# Patient Record
Sex: Female | Born: 1995 | Race: White | Hispanic: No | Marital: Single | Smoking: Never smoker
Health system: Southern US, Community
[De-identification: ages and names within clinical notes are randomized; demographics above are authoritative.]

---
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
## 2016-03-15 DIAGNOSIS — R05 Cough: Secondary | ICD-10-CM | POA: Diagnosis not present

## 2016-03-15 DIAGNOSIS — J069 Acute upper respiratory infection, unspecified: Secondary | ICD-10-CM | POA: Diagnosis not present

## 2016-03-15 DIAGNOSIS — R599 Enlarged lymph nodes, unspecified: Secondary | ICD-10-CM | POA: Diagnosis not present

## 2016-03-16 DIAGNOSIS — J209 Acute bronchitis, unspecified: Secondary | ICD-10-CM | POA: Diagnosis not present

## 2016-03-16 DIAGNOSIS — J301 Allergic rhinitis due to pollen: Secondary | ICD-10-CM | POA: Diagnosis not present

## 2016-08-15 DIAGNOSIS — J0100 Acute maxillary sinusitis, unspecified: Secondary | ICD-10-CM | POA: Diagnosis not present

## 2016-11-02 DIAGNOSIS — K3589 Other acute appendicitis: Secondary | ICD-10-CM | POA: Diagnosis not present

## 2017-02-21 DIAGNOSIS — E860 Dehydration: Secondary | ICD-10-CM | POA: Diagnosis not present

## 2017-02-21 DIAGNOSIS — K529 Noninfective gastroenteritis and colitis, unspecified: Secondary | ICD-10-CM | POA: Diagnosis not present

## 2017-02-21 DIAGNOSIS — J209 Acute bronchitis, unspecified: Secondary | ICD-10-CM | POA: Diagnosis not present

## 2017-03-30 ENCOUNTER — Encounter: Payer: Self-pay | Admitting: Internal Medicine

## 2017-03-30 ENCOUNTER — Ambulatory Visit (INDEPENDENT_AMBULATORY_CARE_PROVIDER_SITE_OTHER): Payer: BLUE CROSS/BLUE SHIELD | Admitting: Internal Medicine

## 2017-03-30 DIAGNOSIS — Z0000 Encounter for general adult medical examination without abnormal findings: Secondary | ICD-10-CM | POA: Diagnosis not present

## 2017-03-30 DIAGNOSIS — Z30011 Encounter for initial prescription of contraceptive pills: Secondary | ICD-10-CM | POA: Diagnosis not present

## 2017-03-30 MED ORDER — NORETHIN ACE-ETH ESTRAD-FE 1-20 MG-MCG PO TABS: 1.0000 | ORAL_TABLET | Freq: Every day | ORAL | 2 refills | Status: DC

## 2017-03-30 NOTE — Progress Notes (Signed)
Patient ID: Rondall Allegra, female   DOB: 05-Jun-1996, 21 y.o.   MRN: [id number]   Subjective:    Patient ID: Rondall Allegra, female    DOB: 12/06/1995, 21 y.o.   MRN: [id number]  HPI  Patient here to establish care.  Has been followed by Dr Tracey Harries at [facility].  Has been healthy.  Stays active.  Enjoys swimming.  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  In college at [employer].  Thinking about elementary education.  Stays active.  No sob.  No acid reflux.  No abdominal pain.  Bowels moving.  LMP 03/05/17.  Menarche - 5th grade.  Regular periods.  Not sexually active.  Does desire to start ocp's.  Discussed risk and possible side effects of the medication.     History reviewed. No pertinent past medical history. Past Surgical History:  Procedure Laterality Date  . TONSILLECTOMY AND ADENOIDECTOMY  2009   Family History  Problem Relation Age of Onset  . Lung cancer Paternal Grandmother    Social History   Social History  . Marital status: Single    Spouse name: N/A  . Number of children: N/A  . Years of education: N/A   Social History Main Topics  . Smoking status: Never Smoker  . Smokeless tobacco: Never Used  . Alcohol use No  . Drug use: No  . Sexual activity: Not Asked   Other Topics Concern  . None   Social History Narrative  . None    Outpatient Encounter Prescriptions as of 03/30/2017  Medication Sig  . norethindrone-ethinyl estradiol (LOESTRIN FE 1/20) 1-20 MG-MCG tablet Take 1 tablet by mouth daily.   No facility-administered encounter medications on file as of 03/30/2017.     Review of Systems  Constitutional: Negative for appetite change and unexpected weight change.  HENT: Negative for congestion and sinus pressure.   Respiratory: Negative for cough, chest tightness and shortness of breath.   Cardiovascular: Negative for chest pain, palpitations and leg swelling.  Gastrointestinal: Negative for abdominal pain, diarrhea, nausea and vomiting.    Genitourinary: Negative for difficulty urinating and dysuria.  Musculoskeletal: Negative for back pain and joint swelling.  Skin: Negative for color change and rash.  Neurological: Negative for dizziness, light-headedness and headaches.  Psychiatric/Behavioral: Negative for agitation and dysphoric mood.       Objective:     Blood pressure rechecked by me:  110/62  Physical Exam  Constitutional: She appears well-developed and well-nourished. No distress.  HENT:  Nose: Nose normal.  Mouth/Throat: Oropharynx is clear and moist.  Neck: Neck supple. No thyromegaly present.  Cardiovascular: Normal rate and regular rhythm.   Pulmonary/Chest: Breath sounds normal. No respiratory distress. She has no wheezes.  Abdominal: Soft. Bowel sounds are normal. There is no tenderness.  Musculoskeletal: She exhibits no edema or tenderness.  Lymphadenopathy:    She has no cervical adenopathy.  Skin: No rash noted. No erythema.  Psychiatric: She has a normal mood and affect. Her behavior is normal.    BP 108/78   Pulse 87   Temp 98.9 F (37.2 C) (Oral)   Resp 12   Ht 5' 4.76" (1.645 m)   Wt 127 lb 12 oz (57.9 kg)   LMP 03/15/2017   SpO2 98%   BMI 21.41 kg/m  Wt Readings from Last 3 Encounters:  03/30/17 127 lb 12 oz (57.9 kg) (51 %, Z= 0.01)*   * Growth percentiles are based on CDC 2-20 Years data.  Assessment & Plan:   Problem List Items Addressed This Visit    Health care maintenance    States up to date with immunizations.  Obtain record from pediatrician.  Schedule for yearly physical.        Oral contraception initiation    Discussed with her today.  Discussed possible side effects and risk of medication.  Discussed when to start medication.  Blood pressure looks good.  Start loestrin as directed.  Get her back in  - in several weeks to check blood pressure to confirm normal after starting ocp's.            Dale DurhamSCOTT, Josha Weekley, MD

## 2017-04-01 ENCOUNTER — Encounter: Payer: Self-pay | Admitting: Internal Medicine

## 2017-04-01 DIAGNOSIS — Z30011 Encounter for initial prescription of contraceptive pills: Secondary | ICD-10-CM | POA: Insufficient documentation

## 2017-04-01 DIAGNOSIS — Z0000 Encounter for general adult medical examination without abnormal findings: Secondary | ICD-10-CM | POA: Insufficient documentation

## 2017-04-01 NOTE — Assessment & Plan Note (Signed)
States up to date with immunizations.  Obtain record from pediatrician.  Schedule for yearly physical.

## 2017-04-01 NOTE — Assessment & Plan Note (Signed)
Discussed with her today.  Discussed possible side effects and risk of medication.  Discussed when to start medication.  Blood pressure looks good.  Start loestrin as directed.  Get her back in  - in several weeks to check blood pressure to confirm normal after starting ocp's.

## 2017-04-09 ENCOUNTER — Encounter: Payer: Self-pay | Admitting: Internal Medicine

## 2017-08-22 ENCOUNTER — Ambulatory Visit (INDEPENDENT_AMBULATORY_CARE_PROVIDER_SITE_OTHER): Payer: BLUE CROSS/BLUE SHIELD

## 2017-08-22 ENCOUNTER — Encounter: Payer: Self-pay | Admitting: Internal Medicine

## 2017-08-22 ENCOUNTER — Ambulatory Visit (INDEPENDENT_AMBULATORY_CARE_PROVIDER_SITE_OTHER): Payer: BLUE CROSS/BLUE SHIELD | Admitting: Internal Medicine

## 2017-08-22 VITALS — BP 114/80 | HR 117 | Temp 100.1°F | Ht 64.96 in | Wt 125.0 lb

## 2017-08-22 DIAGNOSIS — R509 Fever, unspecified: Secondary | ICD-10-CM

## 2017-08-22 DIAGNOSIS — R059 Cough, unspecified: Secondary | ICD-10-CM

## 2017-08-22 DIAGNOSIS — R05 Cough: Secondary | ICD-10-CM

## 2017-08-22 DIAGNOSIS — J181 Lobar pneumonia, unspecified organism: Secondary | ICD-10-CM

## 2017-08-22 DIAGNOSIS — J189 Pneumonia, unspecified organism: Secondary | ICD-10-CM

## 2017-08-22 LAB — POCT INFLUENZA A/B
INFLUENZA B, POC: NEGATIVE
Influenza A, POC: NEGATIVE

## 2017-08-22 LAB — POCT URINE PREGNANCY: Preg Test, Ur: NEGATIVE

## 2017-08-22 MED ORDER — PREDNISONE 10 MG PO TABS: ORAL_TABLET | ORAL | 0 refills | Status: DC

## 2017-08-22 MED ORDER — AZITHROMYCIN 250 MG PO TABS: ORAL_TABLET | ORAL | 0 refills | Status: DC

## 2017-08-22 NOTE — Progress Notes (Signed)
Patient ID: Rondall AllegraKayleigh A Engelbrecht, female   DOB: 15-Jul-1996, 21 y.o.   MRN: [id number]   Subjective:    Patient ID: Rondall AllegraKayleigh A Prada, female    DOB: 15-Jul-1996, 21 y.o.   MRN: [id number]  HPI  Patient here as a work in with concerns regarding persistent fever, cough and congestion.  She is accompanied by her mother.  History obtained from both of them.  Reports that she had had persistent intermittent cough.  Noticed on 08/03/17 - cough and congestion increased.  Started taking nyquil, sudafed and benadryl.  She reports she started feeling better.  Symptoms worsened over the past week (over the last 10 days).  Reports increased cough.  No sore throat.  Eating and drinking.  Some nausea.  No vomiting.  No diarrhea.  Fever 102-103.  Taking motrin.  No sob.  Coughing fits.  No rash.     History reviewed. No pertinent past medical history. Past Surgical History:  Procedure Laterality Date  . TONSILLECTOMY AND ADENOIDECTOMY  2009   Family History  Problem Relation Age of Onset  . Lung cancer Paternal Grandmother    Social History   Social History  . Marital status: Single    Spouse name: N/A  . Number of children: N/A  . Years of education: N/A   Social History Main Topics  . Smoking status: Never Smoker  . Smokeless tobacco: Never Used  . Alcohol use No  . Drug use: No  . Sexual activity: Not Asked   Other Topics Concern  . None   Social History Narrative  . None    Outpatient Encounter Prescriptions as of 08/22/2017  Medication Sig  . azithromycin (ZITHROMAX) 250 MG tablet Take two tablets x 1 day and the one tablet per day for four more days.  . norethindrone-ethinyl estradiol (LOESTRIN FE 1/20) 1-20 MG-MCG tablet Take 1 tablet by mouth daily. (Patient not taking: Reported on 08/22/2017)  . predniSONE (DELTASONE) 10 MG tablet Take 4 tablets x 1 day and then decrease by 1/2 tablet per day until down to zero mg.   No facility-administered encounter medications on file as of  08/22/2017.     Review of Systems  Constitutional: Positive for fever. Negative for appetite change.  HENT: Negative for sinus pressure and sore throat.   Respiratory: Positive for cough. Negative for chest tightness and shortness of breath.   Cardiovascular: Negative for chest pain, palpitations and leg swelling.  Gastrointestinal: Positive for nausea. Negative for abdominal pain and vomiting.  Musculoskeletal: Negative for joint swelling.  Skin: Negative for color change and rash.  Neurological: Negative for dizziness and headaches.       Objective:    Physical Exam  Constitutional: She appears well-developed and well-nourished.  HENT:  Nose: Nose normal.  Mouth/Throat: Oropharynx is clear and moist.  Neck: Neck supple.  No neck stiffness.    Cardiovascular: Normal rate and regular rhythm.   Pulmonary/Chest: Breath sounds normal. No respiratory distress. She has no wheezes.  Increased cough with forced expiration.    Abdominal: Soft. Bowel sounds are normal. There is no tenderness.  Musculoskeletal: She exhibits no edema or tenderness.  Lymphadenopathy:    She has no cervical adenopathy.  Skin: No rash noted.    BP 114/80 (BP Location: Right Arm, Patient Position: Sitting, Cuff Size: Normal)   Pulse (!) 117   Temp 100.1 F (37.8 C) (Oral)   Ht 5' 4.96" (1.65 m)   Wt 125 lb (56.7 kg)   SpO2 95%  BMI 20.83 kg/m  Wt Readings from Last 3 Encounters:  08/22/17 125 lb (56.7 kg) (44 %, Z= -0.15)*  03/30/17 127 lb 12 oz (57.9 kg) (51 %, Z= 0.01)*   * Growth percentiles are based on CDC 2-20 Years data.       Assessment & Plan:   Problem List Items Addressed This Visit    Pneumonia    Pt presented with increased cough, congestion and fever.  Flu swab negative.  CXR returned with LL lobe pneumonia.  Treated with azithromycin.  Also given prednisone taper.  Robitussin DM.  Rest.  Fluids. Follow closely.  Any change or worsening symptoms, she is to be reevaluated.         Relevant Medications   azithromycin (ZITHROMAX) 250 MG tablet    Other Visit Diagnoses    Fever, unspecified fever cause    -  Primary   Relevant Orders   POCT urine pregnancy (Completed)   DG Chest 2 View (Completed)   POCT Influenza A/B (Completed)   Cough       Relevant Orders   DG Chest 2 View (Completed)       Dale [place], MD

## 2017-08-22 NOTE — Patient Instructions (Signed)
Robitussin DM twice a day as needed  Take a probiotic daily while on antibiotic and for two weeks after completing the antibiotic.

## 2017-08-23 ENCOUNTER — Encounter: Payer: Self-pay | Admitting: Internal Medicine

## 2017-08-24 NOTE — Telephone Encounter (Signed)
See my message.  Need a copy of her xray.

## 2017-08-24 NOTE — Telephone Encounter (Signed)
Called office [place] to [place] she will send message to his CMA and have her call me.

## 2017-08-24 NOTE — Telephone Encounter (Signed)
Reviewed her my chart message.  Need a copy of the report of her xray with Dr Tracey HarriesPringle (appears was done 01/2017).  Unable to pull up results.  Notify pt we are trying to get a copy of the xray and also get update on how she is doing.  Thanks

## 2017-08-25 ENCOUNTER — Encounter: Payer: Self-pay | Admitting: Internal Medicine

## 2017-08-25 DIAGNOSIS — J189 Pneumonia, unspecified organism: Secondary | ICD-10-CM | POA: Insufficient documentation

## 2017-08-25 NOTE — Telephone Encounter (Signed)
Noted.  Reviewed cxr report sent - states lungs clear.  Please call and check on pt to confirm doing better.

## 2017-08-25 NOTE — Telephone Encounter (Signed)
Noted.  Thanks.  Needs f/u cxr in one month.

## 2017-08-25 NOTE — Assessment & Plan Note (Signed)
Pt presented with increased cough, congestion and fever.  Flu swab negative.  CXR returned with LL lobe pneumonia.  Treated with azithromycin.  Also given prednisone taper.  Robitussin DM.  Rest.  Fluids. Follow closely.  Any change or worsening symptoms, she is to be reevaluated.

## 2017-08-25 NOTE — Telephone Encounter (Signed)
Called and lm to call office

## 2017-09-08 ENCOUNTER — Telehealth: Payer: Self-pay | Admitting: Internal Medicine

## 2017-09-08 NOTE — Telephone Encounter (Signed)
Patient treated for pneumonia in October and was told she needed a follow up xray.  Verified with flow coordinator  Olegario MessierKathy, and advised patient that she can just walk in for the xray, no appointment is required.

## 2017-11-15 DIAGNOSIS — R300 Dysuria: Secondary | ICD-10-CM | POA: Diagnosis not present

## 2017-12-18 ENCOUNTER — Inpatient Hospital Stay
Admission: RE | Admit: 2017-12-18 | Discharge: 2017-12-18 | Discharge status: Absent without leave | Payer: BLUE CROSS/BLUE SHIELD | Source: Home / Self Care | Attending: Obstetrics and Gynecology | Admitting: Obstetrics and Gynecology

## 2017-12-18 MED ORDER — ACETAMINOPHEN 325 MG PO TABS: 650.0000 mg | ORAL_TABLET | ORAL | Status: DC | PRN

## 2018-04-03 ENCOUNTER — Encounter: Payer: BLUE CROSS/BLUE SHIELD | Admitting: Internal Medicine

## 2018-05-23 IMAGING — DX DG CHEST 2V
2 series · 2 of 2 positions shown · non-contrast
Comparison: None.

CLINICAL DATA: Fever and cough for a week, some shortness of breath

EXAM:
CHEST  2 VIEW

[chest pa]
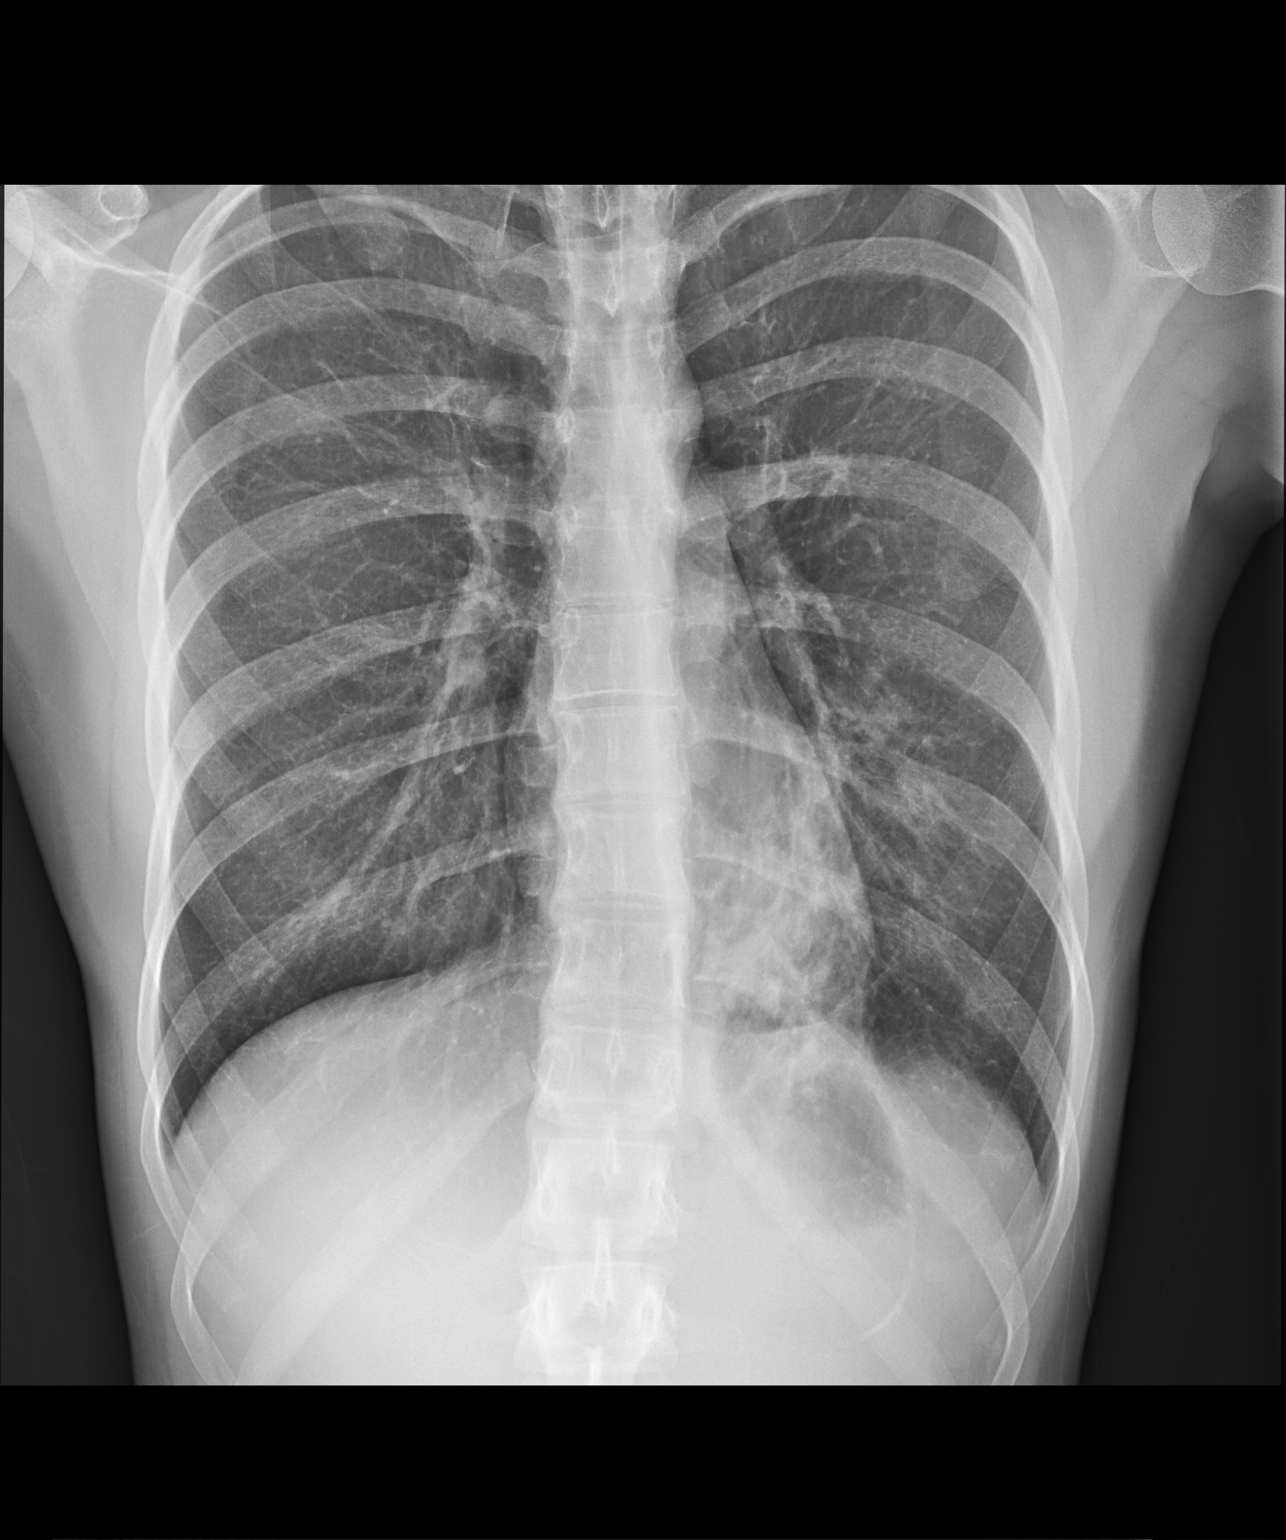

[chest lat]
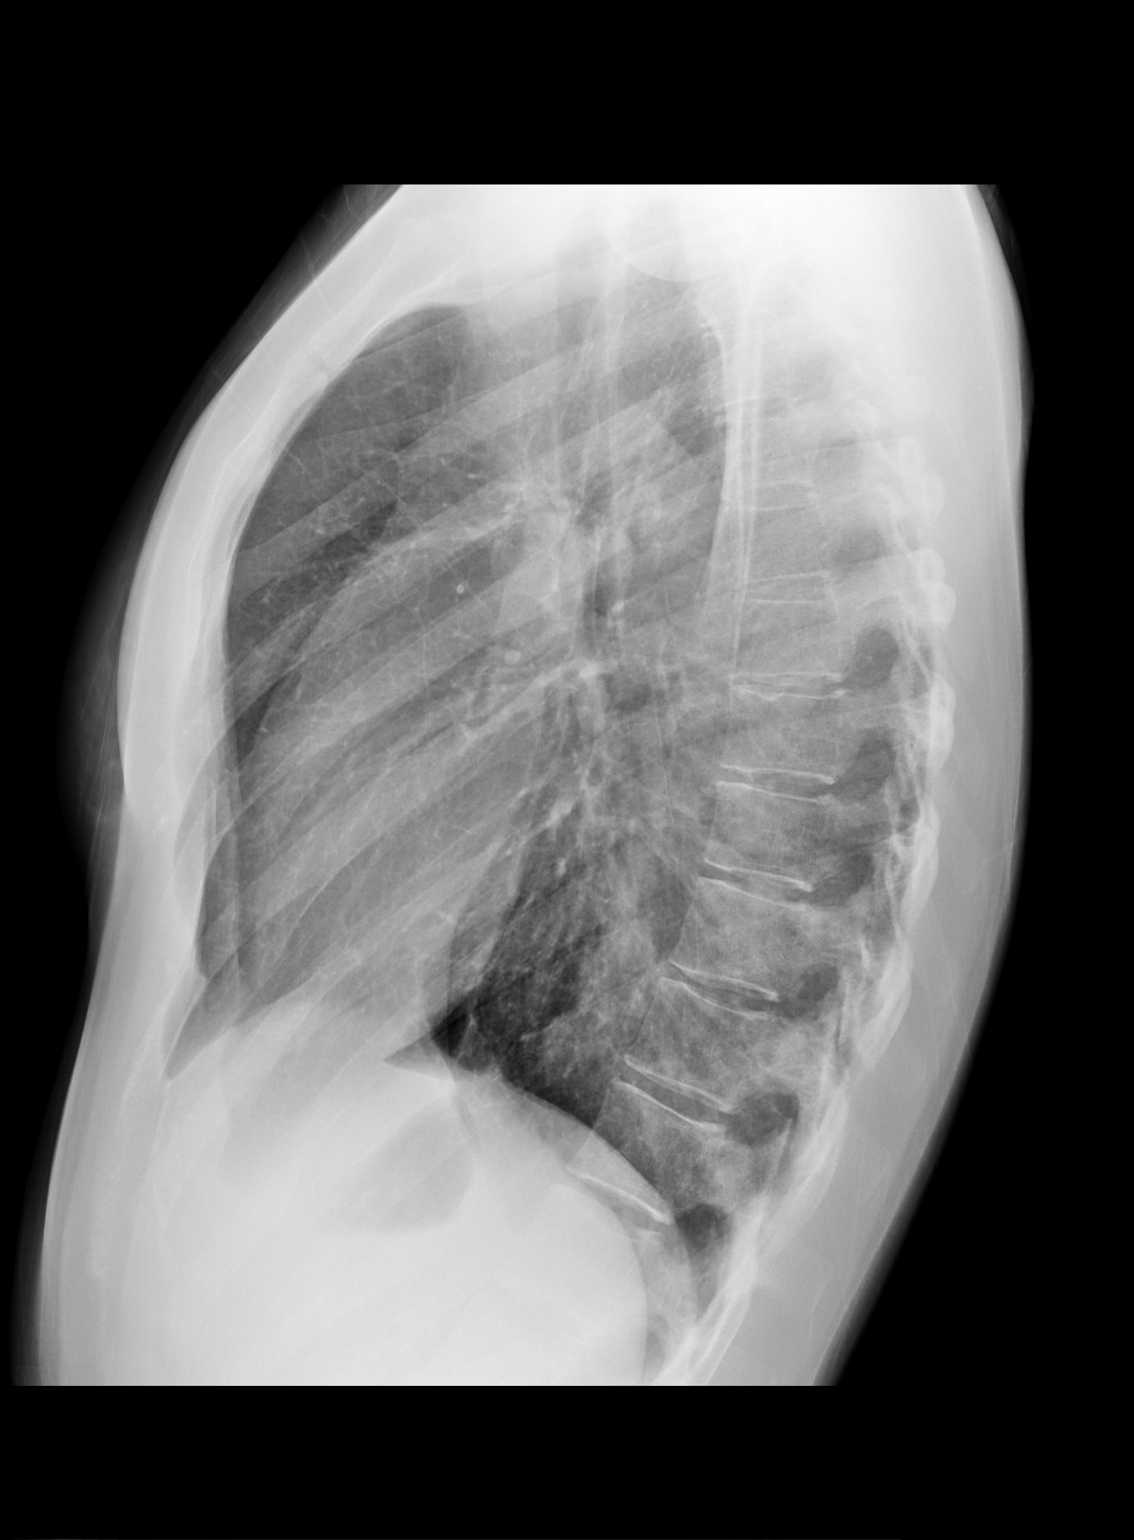

[2 of 2 positions shown; findings below may reference images not displayed]

FINDINGS: The lungs are somewhat hyperaerated. There is parenchymal opacity
posteriorly within the left lower lobe consistent with left lower
lobe pneumonia. No effusion is seen. Mediastinal and hilar contours
are unremarkable. The heart is within normal limits in size. No bony
abnormality is seen.
IMPRESSION: 1. Left lower lobe pneumonia.
2. Slight hyper aeration.

## 2019-01-17 DIAGNOSIS — D485 Neoplasm of uncertain behavior of skin: Secondary | ICD-10-CM | POA: Diagnosis not present

## 2019-01-17 DIAGNOSIS — L738 Other specified follicular disorders: Secondary | ICD-10-CM | POA: Diagnosis not present

## 2019-01-17 DIAGNOSIS — D229 Melanocytic nevi, unspecified: Secondary | ICD-10-CM | POA: Diagnosis not present

## 2019-01-17 DIAGNOSIS — D225 Melanocytic nevi of trunk: Secondary | ICD-10-CM | POA: Diagnosis not present

## 2019-03-01 ENCOUNTER — Telehealth: Payer: Self-pay

## 2019-03-01 NOTE — Telephone Encounter (Signed)
LMTCB. Need to schedule pt for a physical with Dr. Lorin Picket. Pec may speak with pt.

## 2019-06-25 DIAGNOSIS — Z03818 Encounter for observation for suspected exposure to other biological agents ruled out: Secondary | ICD-10-CM | POA: Diagnosis not present

## 2019-06-25 DIAGNOSIS — Z1159 Encounter for screening for other viral diseases: Secondary | ICD-10-CM | POA: Diagnosis not present

## 2019-06-25 DIAGNOSIS — R509 Fever, unspecified: Secondary | ICD-10-CM | POA: Diagnosis not present

## 2019-06-25 DIAGNOSIS — J029 Acute pharyngitis, unspecified: Secondary | ICD-10-CM | POA: Diagnosis not present

## 2019-11-07 ENCOUNTER — Ambulatory Visit
Admission: EM | Admit: 2019-11-07 | Discharge: 2019-11-07 | Disposition: A | Discharge status: Home / Self Care | Payer: BC Managed Care – PPO | Attending: Emergency Medicine | Admitting: Emergency Medicine

## 2019-11-07 ENCOUNTER — Other Ambulatory Visit: Payer: Self-pay

## 2019-11-07 DIAGNOSIS — N390 Urinary tract infection, site not specified: Secondary | ICD-10-CM | POA: Diagnosis not present

## 2019-11-07 DIAGNOSIS — R030 Elevated blood-pressure reading, without diagnosis of hypertension: Secondary | ICD-10-CM | POA: Diagnosis not present

## 2019-11-07 LAB — POCT URINALYSIS DIP (MANUAL ENTRY)
Bilirubin, UA: NEGATIVE
Blood, UA: NEGATIVE
Glucose, UA: 100 mg/dL — AB
Ketones, POC UA: NEGATIVE mg/dL
Leukocytes, UA: NEGATIVE
Nitrite, UA: POSITIVE — AB
Spec Grav, UA: 1.01 (ref 1.010–1.025)
Urobilinogen, UA: 1 E.U./dL
pH, UA: 5 (ref 5.0–8.0)

## 2019-11-07 LAB — POCT FASTING CBG KUC MANUAL ENTRY: POCT Glucose (KUC): 115 mg/dL — AB (ref 70–99)

## 2019-11-07 MED ORDER — CEPHALEXIN 500 MG PO CAPS: 500.0000 mg | ORAL_CAPSULE | Freq: Two times a day (BID) | ORAL | 0 refills | Status: AC

## 2019-11-07 NOTE — ED Triage Notes (Signed)
Patient complains of painful urination and burning x Monday. Patient did take azo.

## 2019-11-07 NOTE — Discharge Instructions (Addendum)
Follow up with your primary care provider in 1-2 weeks for a recheck of your urine.  You have glucose and protein in your urine today.    Take the antibiotic as directed.    Your blood pressure is elevated today at 128/91.  Please have this rechecked by your primary care provider in 2-4 weeks.

## 2019-11-07 NOTE — ED Provider Notes (Signed)
Renaldo Fiddler    CSN: [id number] Arrival date & time: 11/07/19  1430      History   Chief Complaint Chief Complaint  Patient presents with  . Dysuria    HPI Julia Mcintosh is a 24 y.o. female.   Patient presents with dysuria and frequency x4 days.  She has been treating her symptoms with OTC Azo.  She denies fever, chills, abdominal pain, back pain, vaginal discharge, pelvic pain, rash, lesions, or other symptoms.    The history is provided by the patient.    History reviewed. No pertinent past medical history.  Patient Active Problem List   Diagnosis Date Noted  . Indication for care in labor and delivery, antepartum 12/18/2017  . Pneumonia 08/25/2017  . Oral contraception initiation 04/01/2017  . Health care maintenance 04/01/2017    Past Surgical History:  Procedure Laterality Date  . TONSILLECTOMY AND ADENOIDECTOMY  2009    OB History   No obstetric history on file.      Home Medications    Prior to Admission medications   Medication Sig Start Date End Date Taking? Authorizing Provider  azithromycin (ZITHROMAX) 250 MG tablet Take two tablets x 1 day and the one tablet per day for four more days. 08/22/17   Dale Dell, MD  cephALEXin (KEFLEX) 500 MG capsule Take 1 capsule (500 mg total) by mouth 2 (two) times daily for 5 days. 11/07/19 11/12/19  Mickie Bail, NP  norethindrone-ethinyl estradiol (LOESTRIN FE 1/20) 1-20 MG-MCG tablet Take 1 tablet by mouth daily. Patient not taking: Reported on 08/22/2017 03/30/17   Dale [place], MD  predniSONE (DELTASONE) 10 MG tablet Take 4 tablets x 1 day and then decrease by 1/2 tablet per day until down to zero mg. 08/22/17   Dale Cowden, MD    Family History Family History  Problem Relation Age of Onset  . Lung cancer Paternal Grandmother   . Healthy Mother   . Healthy Father     Social History Social History   Tobacco Use  . Smoking status: Never Smoker  . Smokeless tobacco: Never Used    Substance Use Topics  . Alcohol use: Yes    Comment: occasionally  . Drug use: No     Allergies   Codeine   Review of Systems Review of Systems  Constitutional: Negative for chills and fever.  HENT: Negative for ear pain and sore throat.   Eyes: Negative for pain and visual disturbance.  Respiratory: Negative for cough and shortness of breath.   Cardiovascular: Negative for chest pain and palpitations.  Gastrointestinal: Negative for abdominal pain and vomiting.  Genitourinary: Positive for dysuria and frequency. Negative for hematuria.  Musculoskeletal: Negative for arthralgias and back pain.  Skin: Negative for color change and rash.  Neurological: Negative for seizures and syncope.  All other systems reviewed and are negative.    Physical Exam Triage Vital Signs ED Triage Vitals  Enc Vitals Group     BP      Pulse      Resp      Temp      Temp src      SpO2      Weight      Height      Head Circumference      Peak Flow      Pain Score      Pain Loc      Pain Edu?      Excl. in GC?  No data found.  Updated Vital Signs BP (!) 128/91 (BP Location: Left Arm)   Pulse 97   Temp 99.3 F (37.4 C) (Oral)   Resp 15   Ht 5\' 4"  (1.626 m)   Wt 125 lb (56.7 kg)   LMP 10/11/2019   SpO2 98%   BMI 21.46 kg/m   Visual Acuity Right Eye Distance:   Left Eye Distance:   Bilateral Distance:    Right Eye Near:   Left Eye Near:    Bilateral Near:     Physical Exam Vitals and nursing note reviewed.  Constitutional:      General: She is not in acute distress.    Appearance: She is well-developed.  HENT:     Head: Normocephalic and atraumatic.     Mouth/Throat:     Mouth: Mucous membranes are moist.     Pharynx: Oropharynx is clear.  Eyes:     Conjunctiva/sclera: Conjunctivae normal.  Cardiovascular:     Rate and Rhythm: Normal rate and regular rhythm.     Heart sounds: No murmur.  Pulmonary:     Effort: Pulmonary effort is normal. No respiratory  distress.     Breath sounds: Normal breath sounds.  Abdominal:     General: Bowel sounds are normal.     Palpations: Abdomen is soft.     Tenderness: There is no abdominal tenderness. There is no right CVA tenderness, left CVA tenderness, guarding or rebound.  Musculoskeletal:     Cervical back: Neck supple.  Skin:    General: Skin is warm and dry.     Findings: No rash.  Neurological:     General: No focal deficit present.     Mental Status: She is alert and oriented to person, place, and time.  Psychiatric:        Mood and Affect: Mood normal.        Behavior: Behavior normal.      UC Treatments / Results  Labs (all labs ordered are listed, but only abnormal results are displayed) Labs Reviewed  POCT URINALYSIS DIP (MANUAL ENTRY) - Abnormal; Notable for the following components:      Result Value   Color, UA orange (*)    Glucose, UA =100 (*)    Protein Ur, POC trace (*)    Nitrite, UA Positive (*)    All other components within normal limits  POCT FASTING CBG [facility] MANUAL ENTRY - Abnormal; Notable for the following components:   POCT Glucose ([facility]) 115 (*)    All other components within normal limits  URINE CULTURE    EKG   Radiology No results found.  Procedures Procedures (including critical care time)  Medications Ordered in UC Medications - No data to display  Initial Impression / Assessment and Plan / UC Course  I have reviewed the triage vital signs and the nursing notes.  Pertinent labs & imaging results that were available during my care of the patient were reviewed by me and considered in my medical decision making (see chart for details).   UTI.  Elevated blood pressure reading.  Treating with Keflex.  Instructed patient to follow-up with her PCP in 1 to 2 weeks for reevaluation of glucosuria and proteinuria.  Discussed with her that her blood pressure is elevated today needs to be rechecked by her PCP also.  Patient agrees to plan of care.      Final Clinical Impressions(s) / UC Diagnoses   Final diagnoses:  Urinary tract infection without hematuria,  site unspecified  Elevated blood pressure reading     Discharge Instructions     Follow up with your primary care provider in 1-2 weeks for a recheck of your urine.  You have glucose and protein in your urine today.    Take the antibiotic as directed.    Your blood pressure is elevated today at 128/91.  Please have this rechecked by your primary care provider in 2-4 weeks.            ED Prescriptions    Medication Sig Dispense Auth. Provider   cephALEXin (KEFLEX) 500 MG capsule Take 1 capsule (500 mg total) by mouth 2 (two) times daily for 5 days. 10 capsule Mickie Bail, NP     PDMP not reviewed this encounter.   Mickie Bail, NP 11/07/19 [phone]

## 2019-11-10 LAB — URINE CULTURE: Culture: 70000 — AB

## 2019-11-18 ENCOUNTER — Ambulatory Visit (INDEPENDENT_AMBULATORY_CARE_PROVIDER_SITE_OTHER): Payer: BC Managed Care – PPO | Admitting: Internal Medicine

## 2019-11-18 ENCOUNTER — Encounter: Payer: Self-pay | Admitting: Internal Medicine

## 2019-11-18 DIAGNOSIS — R809 Proteinuria, unspecified: Secondary | ICD-10-CM | POA: Diagnosis not present

## 2019-11-18 DIAGNOSIS — R030 Elevated blood-pressure reading, without diagnosis of hypertension: Secondary | ICD-10-CM | POA: Diagnosis not present

## 2019-11-18 DIAGNOSIS — R739 Hyperglycemia, unspecified: Secondary | ICD-10-CM

## 2019-11-18 NOTE — Progress Notes (Signed)
Patient ID: Julia Mcintosh, female   DOB: 12-19-1995, 24 y.o.   MRN: [id number]   Virtual Visit via video Note  This visit type was conducted due to national recommendations for restrictions regarding the COVID-19 pandemic (e.g. social distancing).  This format is felt to be most appropriate for this patient at this time.  All issues noted in this document were discussed and addressed.  No physical exam was performed (except for noted visual exam findings with Video Visits).   I connected with Julia Mcintosh by a video enabled telemedicine application and verified that I am speaking with the correct person using two identifiers. Location patient: home Location provider: work  Persons participating in the virtual visit: patient, provider  The limitations, risks, security and privacy concerns of performing an evaluation and management service by telephone and the availability of in person appointments have been discussed.  The patient expressed understanding and agreed to proceed.   Reason for visit: acute visit.   HPI: She was seen at urgent care for dysuria and increased urinary frequency. Diagnosed with UTI.  Treated with keflex.  Blood pressure elevated.  Instructed to f/u regarding glucosuria and proteinuria.  She reports she is doing well.  No dysuria.  No increased frequency.  No vaginal symptoms.  No fever.  Eating. No nausea or vomiting.  No abdominal pain.  No vaginal problems.  Discussed f/u labs.      ROS: See pertinent positives and negatives per HPI.  History reviewed. No pertinent past medical history.  Past Surgical History:  Procedure Laterality Date  . TONSILLECTOMY AND ADENOIDECTOMY  2009    Family History  Problem Relation Age of Onset  . Lung cancer Paternal Grandmother   . Healthy Mother   . Healthy Father     SOCIAL HX: reviewed.   No current outpatient medications on file.  EXAM:  GENERAL: alert, oriented, appears well and in no acute distress  HEENT:  atraumatic, conjunttiva clear, no obvious abnormalities on inspection of external nose and ears  NECK: normal movements of the head and neck  LUNGS: on inspection no signs of respiratory distress, breathing rate appears normal, no obvious gross SOB, gasping or wheezing  CV: no obvious cyanosis  PSYCH/NEURO: pleasant and cooperative, no obvious depression or anxiety, speech and thought processing grossly intact  ASSESSMENT AND PLAN:  Discussed the following assessment and plan:  Hyperglycemia Found to have slightly elevated glucose.  Not fasting.  Urine with glucosuria.  Recheck fasting lab and urine.  Currently feels good.  Stays active.    Proteinuria Found in recent urine - with infection.  Check metabolic panel and urinalysis.    Elevated blood pressure reading Blood pressure elevated when in with urinary symptoms.  Have her spot check her pressure.  Follow.     Orders Placed This Encounter  Procedures  . CBC with Differential/Platelet    Standing Status:   Future    Standing Expiration Date:   11/17/2020  . Comprehensive metabolic panel    Standing Status:   Future    Standing Expiration Date:   11/17/2020  . Hemoglobin A1c    Standing Status:   Future    Standing Expiration Date:   11/17/2020  . Urinalysis, Routine w reflex microscopic    Standing Status:   Future    Standing Expiration Date:   11/17/2020    No orders of the defined types were placed in this encounter.    I discussed the assessment and treatment plan  with the patient. The patient was provided an opportunity to ask questions and all were answered. The patient agreed with the plan and demonstrated an understanding of the instructions.   The patient was advised to call back or seek an in-person evaluation if the symptoms worsen or if the condition fails to improve as anticipated.   Einar Pheasant, MD

## 2019-11-24 ENCOUNTER — Encounter: Payer: Self-pay | Admitting: Internal Medicine

## 2019-11-24 DIAGNOSIS — R030 Elevated blood-pressure reading, without diagnosis of hypertension: Secondary | ICD-10-CM | POA: Insufficient documentation

## 2019-11-24 DIAGNOSIS — R809 Proteinuria, unspecified: Secondary | ICD-10-CM | POA: Insufficient documentation

## 2019-11-24 NOTE — Assessment & Plan Note (Signed)
Found to have slightly elevated glucose.  Not fasting.  Urine with glucosuria.  Recheck fasting lab and urine.  Currently feels good.  Stays active.

## 2019-11-24 NOTE — Assessment & Plan Note (Signed)
Blood pressure elevated when in with urinary symptoms.  Have her spot check her pressure.  Follow.

## 2019-11-24 NOTE — Assessment & Plan Note (Signed)
Found in recent urine - with infection.  Check metabolic panel and urinalysis.

## 2019-12-02 ENCOUNTER — Other Ambulatory Visit: Payer: Self-pay

## 2019-12-02 ENCOUNTER — Other Ambulatory Visit (INDEPENDENT_AMBULATORY_CARE_PROVIDER_SITE_OTHER): Payer: BC Managed Care – PPO

## 2019-12-02 DIAGNOSIS — R739 Hyperglycemia, unspecified: Secondary | ICD-10-CM

## 2019-12-02 LAB — CBC WITH DIFFERENTIAL/PLATELET
Basophils Absolute: 0 10*3/uL (ref 0.0–0.1)
Basophils Relative: 0.6 % (ref 0.0–3.0)
Eosinophils Absolute: 0.1 10*3/uL (ref 0.0–0.7)
Eosinophils Relative: 1.7 % (ref 0.0–5.0)
HCT: 42 % (ref 36.0–46.0)
Hemoglobin: 14 g/dL (ref 12.0–15.0)
Lymphocytes Relative: 34.3 % (ref 12.0–46.0)
Lymphs Abs: 1.5 10*3/uL (ref 0.7–4.0)
MCHC: 33.4 g/dL (ref 30.0–36.0)
MCV: 84.6 fl (ref 78.0–100.0)
Monocytes Absolute: 0.5 10*3/uL (ref 0.1–1.0)
Monocytes Relative: 11.3 % (ref 3.0–12.0)
Neutro Abs: 2.3 10*3/uL (ref 1.4–7.7)
Neutrophils Relative %: 52.1 % (ref 43.0–77.0)
Platelets: 200 10*3/uL (ref 150.0–400.0)
RBC: 4.97 Mil/uL (ref 3.87–5.11)
RDW: 14 % (ref 11.5–15.5)
WBC: 4.5 10*3/uL (ref 4.0–10.5)

## 2019-12-02 LAB — HEMOGLOBIN A1C: Hgb A1c MFr Bld: 5 % (ref 4.6–6.5)

## 2019-12-02 LAB — COMPREHENSIVE METABOLIC PANEL
ALT: 13 U/L (ref 0–35)
AST: 16 U/L (ref 0–37)
Albumin: 4.7 g/dL (ref 3.5–5.2)
Alkaline Phosphatase: 57 U/L (ref 39–117)
BUN: 16 mg/dL (ref 6–23)
CO2: 27 mEq/L (ref 19–32)
Calcium: 10 mg/dL (ref 8.4–10.5)
Chloride: 103 mEq/L (ref 96–112)
Creatinine, Ser: 0.86 mg/dL (ref 0.40–1.20)
GFR: 82.4 mL/min (ref >60.00)
Glucose, Bld: 85 mg/dL (ref 70–99)
Potassium: 3.9 mEq/L (ref 3.5–5.1)
Sodium: 141 mEq/L (ref 135–145)
Total Bilirubin: 1 mg/dL (ref 0.2–1.2)
Total Protein: 7 g/dL (ref 6.0–8.3)

## 2019-12-02 NOTE — Addendum Note (Signed)
Addended by: Warden Fillers on: 12/02/2019 11:12 AM   Modules accepted: Orders

## 2019-12-03 ENCOUNTER — Telehealth: Payer: Self-pay | Admitting: Lab

## 2019-12-03 NOTE — Telephone Encounter (Signed)
Called Pt No answer left a VM to call office.  

## 2019-12-16 DIAGNOSIS — N3091 Cystitis, unspecified with hematuria: Secondary | ICD-10-CM | POA: Diagnosis not present

## 2019-12-16 DIAGNOSIS — R350 Frequency of micturition: Secondary | ICD-10-CM | POA: Diagnosis not present

## 2020-05-12 ENCOUNTER — Ambulatory Visit
Admission: RE | Admit: 2020-05-12 | Discharge: 2020-05-12 | Disposition: A | Discharge status: Home / Self Care | Payer: BC Managed Care – PPO | Source: Ambulatory Visit | Attending: Family Medicine | Admitting: Family Medicine

## 2020-05-12 ENCOUNTER — Other Ambulatory Visit: Payer: Self-pay

## 2020-05-12 VITALS — BP 117/83 | HR 97 | Temp 98.2°F | Resp 14

## 2020-05-12 DIAGNOSIS — N3000 Acute cystitis without hematuria: Secondary | ICD-10-CM

## 2020-05-12 DIAGNOSIS — R300 Dysuria: Secondary | ICD-10-CM | POA: Diagnosis not present

## 2020-05-12 LAB — POCT URINALYSIS DIP (MANUAL ENTRY)
Bilirubin, UA: NEGATIVE
Glucose, UA: NEGATIVE mg/dL
Ketones, POC UA: NEGATIVE mg/dL
Nitrite, UA: POSITIVE — AB
Protein Ur, POC: NEGATIVE mg/dL
Spec Grav, UA: 1.01 (ref 1.010–1.025)
Urobilinogen, UA: 0.2 E.U./dL
pH, UA: 6 (ref 5.0–8.0)

## 2020-05-12 LAB — POCT URINE PREGNANCY: Preg Test, Ur: NEGATIVE

## 2020-05-12 MED ORDER — METHOCARBAMOL 500 MG PO TABS: 500.0000 mg | ORAL_TABLET | Freq: Two times a day (BID) | ORAL | 0 refills | Status: DC

## 2020-05-12 MED ORDER — NITROFURANTOIN MONOHYD MACRO 100 MG PO CAPS: 100.0000 mg | ORAL_CAPSULE | Freq: Two times a day (BID) | ORAL | 0 refills | Status: DC

## 2020-05-12 NOTE — Discharge Instructions (Addendum)
I have sent in Macrobid for you to take twice a day for 5 days  I have also sent in Robaxin to help with spasms in your urethra and low belly  Follow-up with this office if you are not feeling better in the next 24 to 48 hours  We are going to culture your urine, if there needs to be change in medication we will be in contact with you

## 2020-05-12 NOTE — ED Triage Notes (Signed)
Patient c/o urinary frequency and urgency. Also reports urethral pain.  Denies: abdominal pain  OTC: AZO

## 2020-05-12 NOTE — ED Provider Notes (Signed)
[facility]   CC: UTI  SUBJECTIVE:  Julia Mcintosh is a 24 y.o. female who complains of urinary frequency, urgency and dysuria for the past 2 days.  Patient denies a precipitating event, recent sexual encounter, excessive caffeine intake.  Localizes the pain to the lower abdomen.  Pain is intermittent and describes it as burning.  Has tried OTC medications without relief.  Symptoms are made worse with urination.  Admits to similar symptoms in the past.  Denies fever, chills, nausea, vomiting, abdominal pain, flank pain, abnormal vaginal discharge or bleeding, hematuria.    LMP: Patient's last menstrual period was 05/05/2020 (within days).  ROS: As in HPI.  All other pertinent ROS negative.     History reviewed. No pertinent past medical history. Past Surgical History:  Procedure Laterality Date  . TONSILLECTOMY AND ADENOIDECTOMY  2009   Allergies  Allergen Reactions  . Codeine Itching   No current facility-administered medications on file prior to encounter.   No current outpatient medications on file prior to encounter.   Social History   Socioeconomic History  . Marital status: Single    Spouse name: Not on file  . Number of children: Not on file  . Years of education: Not on file  . Highest education level: Not on file  Occupational History  . Not on file  Tobacco Use  . Smoking status: Never Smoker  . Smokeless tobacco: Never Used  Vaping Use  . Vaping Use: Never used  Substance and Sexual Activity  . Alcohol use: Yes    Comment: occasionally  . Drug use: No  . Sexual activity: Not on file  Other Topics Concern  . Not on file  Social History Narrative  . Not on file   Social Determinants of Health   Financial Resource Strain:   . Difficulty of Paying Living Expenses:   Food Insecurity:   . Worried About Programme researcher, broadcasting/film/video in the Last Year:   . Barista in the Last Year:   Transportation Needs:   . Freight forwarder (Medical):     Marland Kitchen Lack of Transportation (Non-Medical):   Physical Activity:   . Days of Exercise per Week:   . Minutes of Exercise per Session:   Stress:   . Feeling of Stress :   Social Connections:   . Frequency of Communication with Friends and Family:   . Frequency of Social Gatherings with Friends and Family:   . Attends Religious Services:   . Active Member of Clubs or Organizations:   . Attends Banker Meetings:   Marland Kitchen Marital Status:   Intimate Partner Violence:   . Fear of Current or Ex-Partner:   . Emotionally Abused:   Marland Kitchen Physically Abused:   . Sexually Abused:    Family History  Problem Relation Age of Onset  . Lung cancer Paternal Grandmother   . Healthy Mother   . Healthy Father     OBJECTIVE:  Vitals:   05/12/20 0956  BP: 117/83  Pulse: 97  Resp: 14  Temp: 98.2 F (36.8 C)  SpO2: 99%   General appearance: AOx3 in no acute distress HEENT: NCAT.  Oropharynx clear.  Lungs: clear to auscultation bilaterally without adventitious breath sounds Heart: regular rate and rhythm.  Radial pulses 2+ symmetrical bilaterally Abdomen: soft; non-distended; suprapubic tenderness; bowel sounds present; no guarding or rebound tenderness Back: no CVA tenderness Extremities: no edema; symmetrical with no gross deformities Skin: warm and dry Neurologic: Ambulates  from chair to exam table without difficulty Psychological: alert and cooperative; normal mood and affect  Labs Reviewed  POCT URINALYSIS DIP (MANUAL ENTRY) - Abnormal; Notable for the following components:      Result Value   Color, UA orange (*)    Blood, UA trace-intact (*)    Nitrite, UA Positive (*)    Leukocytes, UA Trace (*)    All other components within normal limits  URINE CULTURE  POCT URINE PREGNANCY    ASSESSMENT & PLAN:  1. Dysuria   2. Acute cystitis without hematuria     Meds ordered this encounter  Medications  . nitrofurantoin, macrocrystal-monohydrate, (MACROBID) 100 MG capsule     Sig: Take 1 capsule (100 mg total) by mouth 2 (two) times daily.    Dispense:  10 capsule    Refill:  0    Order Specific Question:   Supervising Provider    Answer:   Merrilee Jansky [id number]  . methocarbamol (ROBAXIN) 500 MG tablet    Sig: Take 1 tablet (500 mg total) by mouth 2 (two) times daily.    Dispense:  20 tablet    Refill:  0    Order Specific Question:   Supervising Provider    Answer:   Merrilee Jansky [id number]    Urine culture sent.   We will call you with abnormal results that need further treatment.  Push fluids and get plenty of rest.   Take antibiotic as directed and to completion Take Robaxin as needed for bladder and urethral spasms Continue Azo as needed Discussed with patient frequency of her UTIs, she may need to see urology she is already had 3 urinary tract infections this year Follow up with PCP if symptoms persists Return here or go to ER if you have any new or worsening symptoms such as fever, worsening abdominal pain, nausea/vomiting, flank pain  Outlined signs and symptoms indicating need for more acute intervention. Patient verbalized understanding. After Visit Summary given.      Moshe Cipro, NP 05/12/20 1019

## 2020-05-14 ENCOUNTER — Encounter: Payer: Self-pay | Admitting: Internal Medicine

## 2020-05-14 LAB — URINE CULTURE: Culture: 50000 — AB

## 2020-05-21 NOTE — Telephone Encounter (Signed)
Pt scheduled  

## 2020-05-21 NOTE — Telephone Encounter (Signed)
Pt contacted my about starting birth control.  Will need appt to discuss.  See attached my chart.

## 2020-06-01 ENCOUNTER — Telehealth (INDEPENDENT_AMBULATORY_CARE_PROVIDER_SITE_OTHER): Payer: BC Managed Care – PPO | Admitting: Internal Medicine

## 2020-06-01 DIAGNOSIS — Z30011 Encounter for initial prescription of contraceptive pills: Secondary | ICD-10-CM | POA: Diagnosis not present

## 2020-06-01 MED ORDER — NORETHIN ACE-ETH ESTRAD-FE 1-20 MG-MCG PO TABS: 1.0000 | ORAL_TABLET | Freq: Every day | ORAL | 2 refills | Status: DC

## 2020-06-01 NOTE — Progress Notes (Signed)
Patient ID: Julia Mcintosh, female   DOB: 10-12-1996, 24 y.o.   MRN: [id number] .   Virtual Visit via video Note  This visit type was conducted due to national recommendations for restrictions regarding the COVID-19 pandemic (e.g. social distancing).  This format is felt to be most appropriate for this patient at this time.  All issues noted in this document were discussed and addressed.  No physical exam was performed (except for noted visual exam findings with Video Visits).   I connected with Julia Mcintosh by a video enabled telemedicine application and verified that I am speaking with the correct person.  Location patient: home Location provider: work Persons participating in the virtual visit: patient, provider  The limitations, risks, security and privacy concerns of performing an evaluation and management service by video and the availability of in person appointment have been discussed.  It has also been discussed with the patient that there may be a patient responsible charge related to this service. The patient expressed understanding and agreed to proceed.   Reason for visit: work in appt  HPI: Work in appt to discuss starting birth control.  She reports she is doing well.   She is having regular periods.  No history of clotting issues or elevated blood pressure.  Discussed birth control options.  She prefers to take pills.  Discussed possible side effects, risks, etc.  Discussed proper way to take medication.  Discussed the need for continued use of condoms to prevent STD.    ROS: See pertinent positives and negatives per HPI.  History reviewed. No pertinent past medical history.  Past Surgical History:  Procedure Laterality Date  . TONSILLECTOMY AND ADENOIDECTOMY  2009    Family History  Problem Relation Age of Onset  . Lung cancer Paternal Grandmother   . Healthy Mother   . Healthy Father     SOCIAL HX: reviewed.    Current Outpatient Medications:  .   methocarbamol (ROBAXIN) 500 MG tablet, Take 1 tablet (500 mg total) by mouth 2 (two) times daily., Disp: 20 tablet, Rfl: 0 .  nitrofurantoin, macrocrystal-monohydrate, (MACROBID) 100 MG capsule, Take 1 capsule (100 mg total) by mouth 2 (two) times daily., Disp: 10 capsule, Rfl: 0 .  norethindrone-ethinyl estradiol (LOESTRIN FE 1/20) 1-20 MG-MCG tablet, Take 1 tablet by mouth daily., Disp: 28 tablet, Rfl: 2  EXAM:  GENERAL: alert, oriented, appears well and in no acute distress  HEENT: atraumatic, conjunttiva clear, no obvious abnormalities on inspection of external nose and ears  NECK: normal movements of the head and neck  LUNGS: on inspection no signs of respiratory distress, breathing rate appears normal, no obvious gross SOB, gasping or wheezing  CV: no obvious cyanosis  PSYCH/NEURO: pleasant and cooperative, no obvious depression or anxiety, speech and thought processing grossly intact  ASSESSMENT AND PLAN:  Discussed the following assessment and plan:  Oral contraception initiation Discussed with her today.  Discussed possible side effects and risk of medication.  Discussed proper way to take the medication.  No history of elevated blood pressure.  No clotting issues.  Will start loestrin.  Follow periods.  Follow blood pressure. Notify me if problems.     Meds ordered this encounter  Medications  . norethindrone-ethinyl estradiol (LOESTRIN FE 1/20) 1-20 MG-MCG tablet    Sig: Take 1 tablet by mouth daily.    Dispense:  28 tablet    Refill:  2     I discussed the assessment and treatment plan with the patient.  The patient was provided an opportunity to ask questions and all were answered. The patient agreed with the plan and demonstrated an understanding of the instructions.   The patient was advised to call back or seek an in-person evaluation if the symptoms worsen or if the condition fails to improve as anticipated.   Einar Pheasant, MD

## 2020-06-14 ENCOUNTER — Encounter: Payer: Self-pay | Admitting: Internal Medicine

## 2020-06-14 ENCOUNTER — Telehealth: Payer: Self-pay | Admitting: Internal Medicine

## 2020-06-14 NOTE — Telephone Encounter (Signed)
Needs physical in 8 weeks.  Please schedule.  Thanks.

## 2020-06-14 NOTE — Assessment & Plan Note (Signed)
Discussed with her today.  Discussed possible side effects and risk of medication.  Discussed proper way to take the medication.  No history of elevated blood pressure.  No clotting issues.  Will start loestrin.  Follow periods.  Follow blood pressure. Notify me if problems.

## 2020-06-16 NOTE — Telephone Encounter (Signed)
Scheduled

## 2020-07-15 DIAGNOSIS — Z1152 Encounter for screening for COVID-19: Secondary | ICD-10-CM | POA: Diagnosis not present

## 2020-07-15 DIAGNOSIS — Z03818 Encounter for observation for suspected exposure to other biological agents ruled out: Secondary | ICD-10-CM | POA: Diagnosis not present

## 2020-07-17 DIAGNOSIS — Z1152 Encounter for screening for COVID-19: Secondary | ICD-10-CM | POA: Diagnosis not present

## 2020-07-17 DIAGNOSIS — Z03818 Encounter for observation for suspected exposure to other biological agents ruled out: Secondary | ICD-10-CM | POA: Diagnosis not present

## 2020-07-28 ENCOUNTER — Other Ambulatory Visit: Payer: Self-pay | Admitting: Internal Medicine

## 2020-08-11 ENCOUNTER — Encounter: Payer: BC Managed Care – PPO | Admitting: Internal Medicine

## 2020-08-24 ENCOUNTER — Encounter: Payer: Self-pay | Admitting: Internal Medicine

## 2020-08-25 MED ORDER — NORETHIN ACE-ETH ESTRAD-FE 1-20 MG-MCG PO TABS: 1.0000 | ORAL_TABLET | Freq: Every day | ORAL | 2 refills | Status: DC

## 2020-12-03 ENCOUNTER — Encounter: Payer: Self-pay | Admitting: *Deleted

## 2021-01-17 ENCOUNTER — Other Ambulatory Visit: Payer: Self-pay | Admitting: Internal Medicine

## 2021-04-14 ENCOUNTER — Encounter: Payer: Self-pay | Admitting: Internal Medicine

## 2021-04-14 NOTE — Telephone Encounter (Signed)
Patient screened and scheduled with Dr French Ana on 6/17.

## 2021-04-16 ENCOUNTER — Ambulatory Visit: Payer: BC Managed Care – PPO | Admitting: Internal Medicine

## 2021-04-16 DIAGNOSIS — Z0289 Encounter for other administrative examinations: Secondary | ICD-10-CM

## 2021-05-04 ENCOUNTER — Other Ambulatory Visit: Payer: Self-pay | Admitting: Internal Medicine

## 2021-07-20 ENCOUNTER — Ambulatory Visit: Payer: BC Managed Care – PPO | Admitting: Dermatology

## 2021-07-24 ENCOUNTER — Other Ambulatory Visit: Payer: Self-pay | Admitting: Internal Medicine

## 2021-07-28 ENCOUNTER — Ambulatory Visit
Admission: RE | Admit: 2021-07-28 | Discharge: 2021-07-28 | Disposition: A | Discharge status: Home / Self Care | Payer: BC Managed Care – PPO | Source: Ambulatory Visit | Attending: Physician Assistant | Admitting: Physician Assistant

## 2021-07-28 ENCOUNTER — Other Ambulatory Visit: Payer: Self-pay

## 2021-07-28 VITALS — BP 131/78 | HR 92 | Temp 98.2°F | Resp 18

## 2021-07-28 DIAGNOSIS — R300 Dysuria: Secondary | ICD-10-CM | POA: Diagnosis not present

## 2021-07-28 DIAGNOSIS — N3000 Acute cystitis without hematuria: Secondary | ICD-10-CM

## 2021-07-28 DIAGNOSIS — R3989 Other symptoms and signs involving the genitourinary system: Secondary | ICD-10-CM | POA: Diagnosis not present

## 2021-07-28 DIAGNOSIS — R350 Frequency of micturition: Secondary | ICD-10-CM | POA: Insufficient documentation

## 2021-07-28 LAB — POCT URINALYSIS DIP (MANUAL ENTRY)
Bilirubin, UA: NEGATIVE
Blood, UA: NEGATIVE
Glucose, UA: NEGATIVE mg/dL
Ketones, POC UA: NEGATIVE mg/dL
Nitrite, UA: NEGATIVE
Protein Ur, POC: NEGATIVE mg/dL
Spec Grav, UA: 1.015 (ref 1.010–1.025)
Urobilinogen, UA: 0.2 E.U./dL
pH, UA: 6 (ref 5.0–8.0)

## 2021-07-28 LAB — POCT URINE PREGNANCY: Preg Test, Ur: NEGATIVE

## 2021-07-28 MED ORDER — NITROFURANTOIN MONOHYD MACRO 100 MG PO CAPS: 100.0000 mg | ORAL_CAPSULE | Freq: Two times a day (BID) | ORAL | 0 refills | Status: AC

## 2021-07-28 NOTE — ED Provider Notes (Signed)
[facility]    CSN: [id number] Arrival date & time: 07/28/21  1557      History   Chief Complaint Chief Complaint  Patient presents with   Appointment   Urinary Tract Infection    HPI Julia Mcintosh is a 25 y.o. female.   Patient presents today with a 3-day history of urinary tract infection symptoms.  She reports urinary frequency and dysuria.  She denies any abdominal pain, nausea, vomiting, fever, pelvic pain, vaginal symptoms.  She has not tried any over-the-counter medication for symptom management.  She does have a history of UTI and states current symptoms are similar but less severe than previous episodes of this condition.  Was last treated for UTI July 2021.  Denies any recent antibiotic use.  She is confident she is not pregnant.  Denies history of nephrolithiasis, single kidney, self-catheterization, recent urogenital procedure.   History reviewed. No pertinent past medical history.  Patient Active Problem List   Diagnosis Date Noted   Proteinuria 11/24/2019   Elevated blood pressure reading 11/24/2019   Hyperglycemia 11/18/2019   Indication for care in labor and delivery, antepartum 12/18/2017   Pneumonia 08/25/2017   Oral contraception initiation 04/01/2017   Health care maintenance 04/01/2017    Past Surgical History:  Procedure Laterality Date   TONSILLECTOMY AND ADENOIDECTOMY  2009    OB History   No obstetric history on file.      Home Medications    Prior to Admission medications   Medication Sig Start Date End Date Taking? Authorizing Provider  AUROVELA FE 1/20 1-20 MG-MCG tablet TAKE 1 TABLET BY MOUTH DAILY 07/26/21   Dale [place], MD  nitrofurantoin, macrocrystal-monohydrate, (MACROBID) 100 MG capsule Take 1 capsule (100 mg total) by mouth 2 (two) times daily for 7 days. 07/28/21 08/04/21  Sabria Florido, Noberto Retort, PA-C    Family History Family History  Problem Relation Age of Onset   Lung cancer Paternal Grandmother    Healthy Mother     Healthy Father     Social History Social History   Tobacco Use   Smoking status: Never   Smokeless tobacco: Never  Vaping Use   Vaping Use: Never used  Substance Use Topics   Alcohol use: Yes    Comment: occasionally   Drug use: No     Allergies   Codeine   Review of Systems Review of Systems  Constitutional:  Positive for activity change. Negative for appetite change, fatigue and fever.  Respiratory:  Negative for cough and shortness of breath.   Cardiovascular:  Negative for chest pain.  Gastrointestinal:  Negative for abdominal pain, diarrhea, nausea and vomiting.  Genitourinary:  Positive for dysuria and frequency. Negative for flank pain, pelvic pain, urgency, vaginal bleeding, vaginal discharge and vaginal pain.  Neurological:  Negative for dizziness, light-headedness and headaches.    Physical Exam Triage Vital Signs ED Triage Vitals  Enc Vitals Group     BP 07/28/21 1618 131/78     Pulse Rate 07/28/21 1618 92     Resp 07/28/21 1618 18     Temp 07/28/21 1618 98.2 F (36.8 C)     Temp Source 07/28/21 1618 Oral     SpO2 07/28/21 1618 99 %     Weight --      Height --      Head Circumference --      Peak Flow --      Pain Score 07/28/21 1632 3     Pain Loc --  Pain Edu? --      Excl. in GC? --    No data found.  Updated Vital Signs BP 131/78 (BP Location: Left Arm)   Pulse 92   Temp 98.2 F (36.8 C) (Oral)   Resp 18   LMP 07/07/2021   SpO2 99%   Visual Acuity Right Eye Distance:   Left Eye Distance:   Bilateral Distance:    Right Eye Near:   Left Eye Near:    Bilateral Near:     Physical Exam Vitals reviewed.  Constitutional:      General: She is awake. She is not in acute distress.    Appearance: Normal appearance. She is well-developed. She is not ill-appearing.     Comments: Very pleasant female appears stated age in no acute distress sitting comfortably in exam room  HENT:     Head: Normocephalic and atraumatic.      Mouth/Throat:     Mouth: Mucous membranes are moist.     Pharynx: Uvula midline. No oropharyngeal exudate or posterior oropharyngeal erythema.  Cardiovascular:     Rate and Rhythm: Normal rate and regular rhythm.     Heart sounds: Normal heart sounds, S1 normal and S2 normal. No murmur heard. Pulmonary:     Effort: Pulmonary effort is normal.     Breath sounds: Normal breath sounds. No wheezing, rhonchi or rales.     Comments: Clear to auscultation bilaterally Abdominal:     General: Bowel sounds are normal.     Palpations: Abdomen is soft.     Tenderness: There is no abdominal tenderness. There is no right CVA tenderness, left CVA tenderness, guarding or rebound.     Comments: Benign abdominal exam; no tenderness palpation.  No CVA tenderness.  Psychiatric:        Behavior: Behavior is cooperative.     UC Treatments / Results  Labs (all labs ordered are listed, but only abnormal results are displayed) Labs Reviewed  POCT URINALYSIS DIP (MANUAL ENTRY) - Abnormal; Notable for the following components:      Result Value   Leukocytes, UA Trace (*)    All other components within normal limits  URINE CULTURE  POCT URINE PREGNANCY    EKG   Radiology No results found.  Procedures Procedures (including critical care time)  Medications Ordered in UC Medications - No data to display  Initial Impression / Assessment and Plan / UC Course  I have reviewed the triage vital signs and the nursing notes.  Pertinent labs & imaging results that were available during my care of the patient were reviewed by me and considered in my medical decision making (see chart for details).      Vital signs and physical exam reassuring today; no indication for emergent evaluation or imaging.  UA showed leukocyte esterase so we will treat empirically with Macrobid given clinical presentation.  Patient was instructed to use backup birth control while taking antibiotics as this can decrease the  effectiveness of OCP.  She was encouraged to rest and drink plenty of fluid.  We discussed alarm symptoms that warrant emergent evaluation.  Strict return precautions given to which she expressed understanding.  Final Clinical Impressions(s) / UC Diagnoses   Final diagnoses:  Bladder pain  Acute cystitis without hematuria  Dysuria  Urinary frequency     Discharge Instructions      We are going to treat you for urinary tract infection.  Please take antibiotics as prescribed.  We will contact you if  your urine culture indicates need to change her antibiotics.  While you are on antibiotics, you should use backup birth control as they can decrease the effectiveness of your birth control.  Make sure you are drinking plenty of fluid.  If you have any worsening symptoms including fever, abdominal pain, nausea, vomiting, blood in your urine you need to be seen immediately.     ED Prescriptions     Medication Sig Dispense Auth. Provider   nitrofurantoin, macrocrystal-monohydrate, (MACROBID) 100 MG capsule Take 1 capsule (100 mg total) by mouth 2 (two) times daily for 7 days. 14 capsule Willies Laviolette K, PA-C      PDMP not reviewed this encounter.   Jeani Hawking, PA-C 07/28/21 1645

## 2021-07-28 NOTE — Discharge Instructions (Addendum)
We are going to treat you for urinary tract infection.  Please take antibiotics as prescribed.  We will contact you if your urine culture indicates need to change her antibiotics.  While you are on antibiotics, you should use backup birth control as they can decrease the effectiveness of your birth control.  Make sure you are drinking plenty of fluid.  If you have any worsening symptoms including fever, abdominal pain, nausea, vomiting, blood in your urine you need to be seen immediately.

## 2021-07-28 NOTE — ED Triage Notes (Signed)
Patient is concerned for uti.  Symptoms started Monday.  Patient reports uncomfortable, burning sensation with and without urination

## 2021-07-29 LAB — URINE CULTURE: Culture: <10000 — AB

## 2021-08-23 ENCOUNTER — Encounter: Payer: Self-pay | Admitting: Internal Medicine

## 2021-08-23 DIAGNOSIS — N949 Unspecified condition associated with female genital organs and menstrual cycle: Secondary | ICD-10-CM

## 2021-08-24 NOTE — Telephone Encounter (Signed)
Order placed for gyn referral.  

## 2021-09-17 DIAGNOSIS — N898 Other specified noninflammatory disorders of vagina: Secondary | ICD-10-CM | POA: Diagnosis not present

## 2021-09-17 DIAGNOSIS — R300 Dysuria: Secondary | ICD-10-CM | POA: Diagnosis not present

## 2021-09-17 DIAGNOSIS — N941 Unspecified dyspareunia: Secondary | ICD-10-CM | POA: Diagnosis not present

## 2021-10-18 ENCOUNTER — Other Ambulatory Visit: Payer: Self-pay | Admitting: Internal Medicine

## 2021-12-25 ENCOUNTER — Other Ambulatory Visit: Payer: Self-pay | Admitting: Internal Medicine

## 2022-03-31 ENCOUNTER — Other Ambulatory Visit: Payer: Self-pay | Admitting: Internal Medicine

## 2022-03-31 NOTE — Telephone Encounter (Signed)
LMTCB for appointment before medication can be filled.

## 2022-04-21 ENCOUNTER — Telehealth: Payer: Self-pay

## 2022-04-21 NOTE — Telephone Encounter (Signed)
LVM to see if she is still patient here  cause she has not been seen here since or has she established service with another provider?

## 2024-06-17 ENCOUNTER — Ambulatory Visit

## 2024-06-17 DIAGNOSIS — D229 Melanocytic nevi, unspecified: Secondary | ICD-10-CM

## 2024-06-17 DIAGNOSIS — D2271 Melanocytic nevi of right lower limb, including hip: Secondary | ICD-10-CM | POA: Diagnosis not present

## 2024-06-17 DIAGNOSIS — D225 Melanocytic nevi of trunk: Secondary | ICD-10-CM

## 2024-06-17 DIAGNOSIS — D485 Neoplasm of uncertain behavior of skin: Secondary | ICD-10-CM

## 2024-06-17 DIAGNOSIS — L578 Other skin changes due to chronic exposure to nonionizing radiation: Secondary | ICD-10-CM | POA: Diagnosis not present

## 2024-06-17 DIAGNOSIS — W908XXA Exposure to other nonionizing radiation, initial encounter: Secondary | ICD-10-CM

## 2024-06-17 DIAGNOSIS — Z840 Family history of diseases of the skin and subcutaneous tissue: Secondary | ICD-10-CM

## 2024-06-17 HISTORY — DX: Melanocytic nevi, unspecified: D22.9

## 2024-06-17 NOTE — Patient Instructions (Signed)

## 2024-06-17 NOTE — Progress Notes (Signed)
 New Patient Visit   Subjective  Julia Mcintosh is a 28 y.o. female who presents for the following: Pt concerned about new spots/changing on the chest, face, and R toe (darker,change in appearance). No personal of skin cancer. Hx of skin cancer in grandfather, unknown which type).  The following portions of the chart were reviewed this encounter and updated as appropriate: medications, allergies, medical history  Review of Systems:  No other skin or systemic complaints except as noted in HPI or Assessment and Plan.  Objective  Well appearing patient in no apparent distress; mood and affect are within normal limits.   A focused examination was performed of the following areas: the face, chest,upper/lower extremities and R toes/foot   Relevant exam findings are noted in the Assessment and Plan.  R 3rd toe med aspect at webspace 0.5 x 0.3 cm dark brown irregular mole.  R chest 0.2 cm dark brown papule.   Assessment & Plan            MELANOCYTIC NEVI Exam: Tan-brown and/or pink-flesh-colored symmetric macules and papules Treatment Plan: Benign appearing on exam today. Recommend observation. Call clinic for new or changing moles. Recommend daily use of broad spectrum spf 30+ sunscreen to sun-exposed areas.   FAMILY HISTORY OF SKIN CANCER What type(s): unknown Who affected: grandfather NEOPLASM OF UNCERTAIN BEHAVIOR OF SKIN (2) R 3rd toe med aspect at webspace Epidermal / dermal shaving  Lesion diameter (cm):  0.5 Informed consent: discussed and consent obtained   Timeout: patient name, date of birth, surgical site, and procedure verified   Procedure prep:  Patient was prepped and draped in usual sterile fashion Prep type:  Isopropyl alcohol Anesthesia: the lesion was anesthetized in a standard fashion   Anesthetic:  1% lidocaine w/ epinephrine 1-100,000 buffered w/ 8.4% NaHCO3 Instrument used: DermaBlade   Hemostasis achieved with: pressure and aluminum chloride    Outcome: patient tolerated procedure well   Post-procedure details: sterile dressing applied and wound care instructions given   Dressing type: bandage (Mupirocin 2% ointment)    Specimen 1 - Surgical pathology Differential Diagnosis: D48.5 r/o dysplastic nevus Check Margins: Yes R chest Epidermal / dermal shaving  Lesion diameter (cm):  0.2 Informed consent: discussed and consent obtained   Timeout: patient name, date of birth, surgical site, and procedure verified   Procedure prep:  Patient was prepped and draped in usual sterile fashion Prep type:  Isopropyl alcohol Anesthesia: the lesion was anesthetized in a standard fashion   Anesthetic:  1% lidocaine w/ epinephrine 1-100,000 buffered w/ 8.4% NaHCO3 Instrument used: DermaBlade   Hemostasis achieved with: pressure and aluminum chloride   Outcome: patient tolerated procedure well   Post-procedure details: sterile dressing applied and wound care instructions given   Dressing type: bandage (Mupirocin 2% ointment)    Specimen 2 - Surgical pathology Differential Diagnosis: D48.5 r/o dysplastic nevus Check Margins: Yes  ACTINIC DAMAGE - chronic, secondary to cumulative UV radiation exposure/sun exposure over time - diffuse scaly erythematous macules with underlying dyspigmentation - Recommend daily broad spectrum sunscreen SPF 30+ to sun-exposed areas, reapply every 2 hours as needed.  - Recommend staying in the shade or wearing Dolle sleeves, sun glasses (UVA+UVB protection) and wide brim hats (4-inch brim around the entire circumference of the hat). - Call for new or changing lesions.  Return in about 1 year (around 06/17/2025) for TBSE.  LILLETTE Rosina Mayans, CMA, am acting as scribe for Lauraine JAYSON Kanaris, MD .   Documentation: I have  reviewed the above documentation for accuracy and completeness, and I agree with the above.  Lauraine JAYSON Kanaris, MD

## 2024-06-21 LAB — SURGICAL PATHOLOGY

## 2024-06-24 ENCOUNTER — Ambulatory Visit: Payer: Self-pay

## 2024-06-24 NOTE — Telephone Encounter (Signed)
-----   Message from Lauraine JAYSON Kanaris sent at 06/24/2024  8:51 AM EDT -----    1. Skin, R 3rd toe med aspect at webspace :       DYSPLASTIC COMPOUND NEVUS WITH MODERATE ATYPIA        2. Skin, R chest :       INTRADERMAL MELANOCYTIC NEVUS   Please notify of dysplastic nevus moderate atypia and benign lesion on chest. No further treatment.    Your biopsy result showed an abnormal mole called a dysplastic nevus.  It is not abnormal enough that we need to do any further treatment, however if the mole were to return (even a little bit) then  we would need to treat it further with another biopsy or surgery.  Please let us  know if you notice this and we will also follow you during your regular skin check visits.  This mole in particular is  not necessarily a precursor to melanoma, but if you have a lot of these abnormal moles it does mean you have a higher risk of melanoma.    Below is some information from the [employer]: ATYPICAL MOLES are unusual-looking benign (noncancerous) moles, also known as dysplastic nevi (the plural of nevus or mole). Atypical moles may resemble melanoma, and people who have them are at  increased risk of developing melanoma in a mole or elsewhere on the body. The higher the number of these moles someone has, the higher the risk. Those who have 10 or more have 12 times the risk of  developing melanoma compared with the general population.   Heredity appears to play a part in the formation of atypical moles. They tend to run in families, especially in Caucasians; about 2 to 8 percent of Caucasians have these moles. Those who have  atypical moles plus a family history of melanoma (two or more close blood relatives with the disease) have a very high risk of developing melanoma. People who have atypical moles, but no family  history of melanoma, are also at higher risk of developing melanoma compared with the general population. So are those with 50 or more normal moles.  All of these high-risk individuals should practice  rigorous daily sun protection, perform a monthly skin self-examination head to toe and seek regular professional skin exams.  ----- Message ----- From: Interface, [facility] In [facility] Sent: 06/21/2024   9:17 AM EDT To: Lauraine JAYSON Kanaris, MD

## 2024-06-24 NOTE — Telephone Encounter (Signed)
 Discussed pathology results with patient. She had no further questions or concerns.

## 2025-06-18 ENCOUNTER — Ambulatory Visit
